# Patient Record
Sex: Male | Born: 1985 | Race: White | Hispanic: No | Marital: Married | Smoking: Never smoker
Health system: Southern US, Community
[De-identification: ages and names within clinical notes are randomized; demographics above are authoritative.]

## PROBLEM LIST (undated history)

## (undated) HISTORY — PX: TONSILLECTOMY: SUR1361

---

## 2018-04-25 MED FILL — CONCERTA 54 MG TABLET ER: 54 | 30 days supply | Qty: 30 | Fill #0

## 2018-04-25 MED FILL — METHYLPHENIDATE 10 MG TAB: 10 | 30 days supply | Qty: 60 | Fill #0

## 2018-05-31 MED FILL — CONCERTA 54 MG TABLET ER: 54 | 30 days supply | Qty: 30 | Fill #0

## 2018-05-31 MED FILL — METHYLPHENIDATE 10 MG TAB: 10 | 30 days supply | Qty: 60 | Fill #0

## 2018-06-30 MED FILL — CONCERTA 54 MG TABLET ER: 54 | 30 days supply | Qty: 30 | Fill #0

## 2018-06-30 MED FILL — METHYLPHENIDATE 10 MG TAB: 10 | 30 days supply | Qty: 60 | Fill #0

## 2018-08-01 MED FILL — CONCERTA 54 MG TABLET ER: 54 | 30 days supply | Qty: 30 | Fill #0

## 2018-08-01 MED FILL — METHYLPHENIDATE 10 MG TAB: 10 | 30 days supply | Qty: 60 | Fill #0

## 2018-09-15 MED FILL — METHYLPHENIDATE 10 MG TAB: 10 | 30 days supply | Qty: 60 | Fill #0

## 2018-09-15 MED FILL — CONCERTA 54 MG TABLET ER: 54 | 30 days supply | Qty: 30 | Fill #0

## 2018-10-17 DIAGNOSIS — F900 Attention-deficit hyperactivity disorder, predominantly inattentive type: Secondary | ICD-10-CM | POA: Diagnosis not present

## 2018-10-18 MED FILL — METHYLPHENIDATE 10 MG TAB: 10 | 60 days supply | Qty: 60 | Fill #0

## 2018-10-18 MED FILL — CONCERTA 54 MG TABLET ER: 54 | 30 days supply | Qty: 30 | Fill #0

## 2018-11-15 DIAGNOSIS — K921 Melena: Secondary | ICD-10-CM | POA: Diagnosis not present

## 2018-11-15 DIAGNOSIS — K644 Residual hemorrhoidal skin tags: Secondary | ICD-10-CM | POA: Diagnosis not present

## 2018-11-15 MED FILL — PROCTOZONE-HC 2.5 % CREA: 2.5 | 10 days supply | Qty: 30 | Fill #0

## 2018-11-27 MED FILL — CONCERTA 54 MG TABLET ER: 54 | 30 days supply | Qty: 30 | Fill #0

## 2018-12-05 MED FILL — METHYLPHENIDATE 10 MG TAB: 10 | 30 days supply | Qty: 60 | Fill #0

## 2018-12-30 MED FILL — METHYLPHENIDATE 10 MG TAB: 10 | 30 days supply | Qty: 60 | Fill #0

## 2018-12-30 MED FILL — CONCERTA 54 MG TABLET ER: 54 | 30 days supply | Qty: 30 | Fill #0

## 2019-02-08 MED FILL — CONCERTA 54 MG TABLET ER: 54 | 30 days supply | Qty: 30 | Fill #0

## 2019-02-08 MED FILL — METHYLPHENIDATE 10 MG TAB: 10 | 30 days supply | Qty: 60 | Fill #0

## 2019-03-14 MED FILL — CONCERTA 54 MG TABLET ER: 54 | 30 days supply | Qty: 30 | Fill #0

## 2019-03-14 MED FILL — METHYLPHENIDATE 10 MG TAB: 10 | 30 days supply | Qty: 60 | Fill #0

## 2019-04-26 MED FILL — METHYLPHENIDATE 10 MG TAB: 10 | 30 days supply | Qty: 60 | Fill #0

## 2019-04-26 MED FILL — CONCERTA 54 MG TABLET ER: 54 | 30 days supply | Qty: 30 | Fill #0

## 2019-05-07 DIAGNOSIS — M545 Low back pain: Secondary | ICD-10-CM | POA: Diagnosis not present

## 2019-05-07 DIAGNOSIS — F909 Attention-deficit hyperactivity disorder, unspecified type: Secondary | ICD-10-CM | POA: Diagnosis not present

## 2019-05-07 DIAGNOSIS — Z713 Dietary counseling and surveillance: Secondary | ICD-10-CM | POA: Diagnosis not present

## 2019-06-14 DIAGNOSIS — Z20828 Contact with and (suspected) exposure to other viral communicable diseases: Secondary | ICD-10-CM | POA: Diagnosis not present

## 2019-06-14 DIAGNOSIS — R030 Elevated blood-pressure reading, without diagnosis of hypertension: Secondary | ICD-10-CM | POA: Diagnosis not present

## 2019-06-16 DIAGNOSIS — Z7189 Other specified counseling: Secondary | ICD-10-CM | POA: Diagnosis not present

## 2019-06-16 DIAGNOSIS — Z03818 Encounter for observation for suspected exposure to other biological agents ruled out: Secondary | ICD-10-CM | POA: Diagnosis not present

## 2019-07-13 MED FILL — METHYLPHENIDATE 10 MG TAB: 10 | 30 days supply | Qty: 60 | Fill #0

## 2019-07-13 MED FILL — CONCERTA 54 MG TABLET ER: 54 | 30 days supply | Qty: 30 | Fill #0

## 2019-07-16 ENCOUNTER — Other Ambulatory Visit: Payer: Self-pay

## 2019-07-16 ENCOUNTER — Ambulatory Visit: Payer: Self-pay | Attending: Internal Medicine

## 2019-07-16 DIAGNOSIS — Z20828 Contact with and (suspected) exposure to other viral communicable diseases: Secondary | ICD-10-CM | POA: Insufficient documentation

## 2019-07-16 DIAGNOSIS — Z20822 Contact with and (suspected) exposure to covid-19: Secondary | ICD-10-CM

## 2019-07-17 LAB — NOVEL CORONAVIRUS, NAA: SARS-CoV-2, NAA: NOT DETECTED

## 2019-07-28 DIAGNOSIS — U071 COVID-19: Secondary | ICD-10-CM | POA: Diagnosis not present

## 2019-07-28 DIAGNOSIS — Z9189 Other specified personal risk factors, not elsewhere classified: Secondary | ICD-10-CM | POA: Diagnosis not present

## 2019-07-30 MED FILL — predniSONE 5 MG TABS: 5 | 6 days supply | Qty: 21 | Fill #0

## 2019-07-30 MED FILL — AZITHROMYCIN 250 MG TABLET: 250 | 5 days supply | Qty: 6 | Fill #0

## 2019-08-21 MED FILL — CONCERTA 54 MG TABLET ER: 54 | 30 days supply | Qty: 30 | Fill #0

## 2019-08-21 MED FILL — METHYLPHENIDATE 10 MG TAB: 10 | 30 days supply | Qty: 60 | Fill #0

## 2020-11-24 DIAGNOSIS — R079 Chest pain, unspecified: Secondary | ICD-10-CM | POA: Diagnosis not present

## 2020-11-24 DIAGNOSIS — F909 Attention-deficit hyperactivity disorder, unspecified type: Secondary | ICD-10-CM | POA: Diagnosis not present

## 2020-11-24 DIAGNOSIS — E7800 Pure hypercholesterolemia, unspecified: Secondary | ICD-10-CM | POA: Diagnosis not present

## 2021-01-08 DIAGNOSIS — M25511 Pain in right shoulder: Secondary | ICD-10-CM | POA: Diagnosis not present

## 2021-01-08 DIAGNOSIS — M25562 Pain in left knee: Secondary | ICD-10-CM | POA: Diagnosis not present

## 2021-03-26 ENCOUNTER — Ambulatory Visit
Admission: RE | Admit: 2021-03-26 | Discharge: 2021-03-26 | Disposition: A | Discharge status: Home / Self Care | Payer: BC Managed Care – PPO | Source: Ambulatory Visit | Attending: Sports Medicine | Admitting: Sports Medicine

## 2021-03-26 ENCOUNTER — Other Ambulatory Visit: Payer: Self-pay | Admitting: Sports Medicine

## 2021-03-26 DIAGNOSIS — M25562 Pain in left knee: Secondary | ICD-10-CM

## 2021-07-14 DIAGNOSIS — F909 Attention-deficit hyperactivity disorder, unspecified type: Secondary | ICD-10-CM | POA: Diagnosis not present

## 2021-07-14 DIAGNOSIS — M25569 Pain in unspecified knee: Secondary | ICD-10-CM | POA: Diagnosis not present

## 2021-07-14 DIAGNOSIS — H04203 Unspecified epiphora, bilateral lacrimal glands: Secondary | ICD-10-CM | POA: Diagnosis not present

## 2021-07-14 DIAGNOSIS — I10 Essential (primary) hypertension: Secondary | ICD-10-CM | POA: Diagnosis not present

## 2021-08-27 ENCOUNTER — Ambulatory Visit: Payer: BC Managed Care – PPO | Admitting: Allergy

## 2021-08-31 DIAGNOSIS — Z412 Encounter for routine and ritual male circumcision: Secondary | ICD-10-CM | POA: Diagnosis not present

## 2021-08-31 DIAGNOSIS — Z051 Observation and evaluation of newborn for suspected infectious condition ruled out: Secondary | ICD-10-CM | POA: Diagnosis not present

## 2021-08-31 DIAGNOSIS — Z23 Encounter for immunization: Secondary | ICD-10-CM | POA: Diagnosis not present

## 2021-09-05 DIAGNOSIS — Z00110 Health examination for newborn under 8 days old: Secondary | ICD-10-CM | POA: Diagnosis not present

## 2021-09-09 DIAGNOSIS — R633 Feeding difficulties, unspecified: Secondary | ICD-10-CM | POA: Diagnosis not present

## 2021-09-28 DIAGNOSIS — Z00129 Encounter for routine child health examination without abnormal findings: Secondary | ICD-10-CM | POA: Diagnosis not present

## 2021-09-28 DIAGNOSIS — Z23 Encounter for immunization: Secondary | ICD-10-CM | POA: Diagnosis not present

## 2021-10-21 DIAGNOSIS — R1110 Vomiting, unspecified: Secondary | ICD-10-CM | POA: Diagnosis not present

## 2021-11-03 DIAGNOSIS — Z00129 Encounter for routine child health examination without abnormal findings: Secondary | ICD-10-CM | POA: Diagnosis not present

## 2021-11-03 DIAGNOSIS — Z23 Encounter for immunization: Secondary | ICD-10-CM | POA: Diagnosis not present

## 2021-12-29 ENCOUNTER — Other Ambulatory Visit (HOSPITAL_COMMUNITY): Payer: Self-pay

## 2021-12-29 MED ORDER — METHYLPHENIDATE HCL ER (OSM) 54 MG PO TBCR
54.0000 mg | EXTENDED_RELEASE_TABLET | Freq: Every morning | ORAL | 0 refills | Status: AC
  Filled 2021-12-29 (×3): qty 16, 16d supply, fill #0

## 2021-12-30 ENCOUNTER — Other Ambulatory Visit (HOSPITAL_COMMUNITY): Payer: Self-pay

## 2021-12-30 MED ORDER — METHYLPHENIDATE HCL 20 MG PO TABS
20.0000 mg | ORAL_TABLET | Freq: Two times a day (BID) | ORAL | 0 refills | Status: DC
  Filled 2021-12-30 (×2): qty 32, 16d supply, fill #0

## 2021-12-31 ENCOUNTER — Other Ambulatory Visit (HOSPITAL_COMMUNITY): Payer: Self-pay

## 2022-03-08 DIAGNOSIS — H00019 Hordeolum externum unspecified eye, unspecified eyelid: Secondary | ICD-10-CM | POA: Diagnosis not present

## 2022-03-08 DIAGNOSIS — R0683 Snoring: Secondary | ICD-10-CM | POA: Diagnosis not present

## 2022-03-08 DIAGNOSIS — I10 Essential (primary) hypertension: Secondary | ICD-10-CM | POA: Diagnosis not present

## 2022-03-09 DIAGNOSIS — E7800 Pure hypercholesterolemia, unspecified: Secondary | ICD-10-CM | POA: Diagnosis not present

## 2022-03-09 DIAGNOSIS — R7301 Impaired fasting glucose: Secondary | ICD-10-CM | POA: Diagnosis not present

## 2022-03-09 DIAGNOSIS — E8889 Other specified metabolic disorders: Secondary | ICD-10-CM | POA: Diagnosis not present

## 2022-03-09 DIAGNOSIS — Z6841 Body Mass Index (BMI) 40.0 and over, adult: Secondary | ICD-10-CM | POA: Diagnosis not present

## 2022-03-09 DIAGNOSIS — Z9189 Other specified personal risk factors, not elsewhere classified: Secondary | ICD-10-CM | POA: Diagnosis not present

## 2022-03-09 DIAGNOSIS — R5383 Other fatigue: Secondary | ICD-10-CM | POA: Diagnosis not present

## 2022-03-23 DIAGNOSIS — R7309 Other abnormal glucose: Secondary | ICD-10-CM | POA: Diagnosis not present

## 2022-03-23 DIAGNOSIS — H16142 Punctate keratitis, left eye: Secondary | ICD-10-CM | POA: Diagnosis not present

## 2022-03-25 DIAGNOSIS — G4719 Other hypersomnia: Secondary | ICD-10-CM | POA: Diagnosis not present

## 2022-03-25 DIAGNOSIS — Z6841 Body Mass Index (BMI) 40.0 and over, adult: Secondary | ICD-10-CM | POA: Diagnosis not present

## 2022-03-25 DIAGNOSIS — E8881 Metabolic syndrome: Secondary | ICD-10-CM | POA: Diagnosis not present

## 2022-03-25 DIAGNOSIS — F909 Attention-deficit hyperactivity disorder, unspecified type: Secondary | ICD-10-CM | POA: Diagnosis not present

## 2022-04-21 ENCOUNTER — Ambulatory Visit: Payer: BC Managed Care – PPO | Admitting: Allergy

## 2022-05-03 DIAGNOSIS — G4733 Obstructive sleep apnea (adult) (pediatric): Secondary | ICD-10-CM | POA: Diagnosis not present

## 2022-05-26 ENCOUNTER — Ambulatory Visit: Payer: BC Managed Care – PPO | Admitting: Allergy

## 2022-06-03 DIAGNOSIS — G4733 Obstructive sleep apnea (adult) (pediatric): Secondary | ICD-10-CM | POA: Diagnosis not present

## 2022-07-02 ENCOUNTER — Ambulatory Visit: Payer: BC Managed Care – PPO | Admitting: Allergy

## 2022-07-03 DIAGNOSIS — G4733 Obstructive sleep apnea (adult) (pediatric): Secondary | ICD-10-CM | POA: Diagnosis not present

## 2022-07-09 ENCOUNTER — Other Ambulatory Visit: Payer: Self-pay

## 2022-07-09 ENCOUNTER — Ambulatory Visit (INDEPENDENT_AMBULATORY_CARE_PROVIDER_SITE_OTHER): Payer: BC Managed Care – PPO | Admitting: Allergy

## 2022-07-09 ENCOUNTER — Encounter: Payer: Self-pay | Admitting: Allergy

## 2022-07-09 VITALS — BP 116/90 | HR 84 | Temp 98.0°F | Resp 20 | Ht 77.0 in | Wt >= 6400 oz

## 2022-07-09 DIAGNOSIS — L505 Cholinergic urticaria: Secondary | ICD-10-CM

## 2022-07-09 DIAGNOSIS — H1013 Acute atopic conjunctivitis, bilateral: Secondary | ICD-10-CM

## 2022-07-09 DIAGNOSIS — J3089 Other allergic rhinitis: Secondary | ICD-10-CM | POA: Diagnosis not present

## 2022-07-09 MED ORDER — OLOPATADINE HCL 0.2 % OP SOLN: OPHTHALMIC | 5 refills | Status: AC

## 2022-07-09 MED ORDER — RYALTRIS 665-25 MCG/ACT NA SUSP: NASAL | 5 refills | Status: AC

## 2022-07-09 NOTE — Patient Instructions (Addendum)
Allergies - Testing today showed: grasses, ragweed, weeds, trees, and outdoor (with indoor) molds. - Copy of test results provided.  - Avoidance measures provided. - Start taking: Xyzal (levocetirizine) 5mg  tablet once daily. Ryaltris (olopatadine/mometasone) two sprays per nostril 1-2 times daily as needed. Pataday 1 drop each eye daily as needed for itchy/watery eyes.  - You can use an extra dose of the antihistamine, if needed, for breakthrough symptoms.  - Perform nasal saline rinses 1-2 times daily to remove allergens from the nasal cavities as well as help with mucous clearance (this is especially helpful to do before the nasal sprays are given) - Consider allergy shots as a means of long-term control. - Allergy shots "re-train" and "reset" the immune system to ignore environmental allergens and decrease the resulting immune response to those allergens (sneezing, itchy watery eyes, runny nose, nasal congestion, etc).    - Allergy shots improve symptoms in 75-85% of patients.  - We can discuss more at the next appointment if the medications are not working for you.  Cholinergic urticaria (hives)  - Hives can be caused by a variety of different triggers including illness/infection, foods, medications, stings, exercise, pressure, vibrations, extremes of temperature to name a few however majority of the time there is no identifiable trigger.   Cholinergic hives occur due to increases in body temperature  - during warmer months or times of exercise would use Xyzal 1 tab twice a day with Pepcid (has antihistamine properties) twice a day to help with rash control  Follow-up in 4-6 months or sooner if needed

## 2022-07-09 NOTE — Progress Notes (Signed)
New Patient Note  RE: Tony Graham MRN: [id number] DOB: 09-02-1985 Date of Office Visit: 07/09/2022  Primary care provider: Dr Lewie Chamber  Chief Complaint: allergies and rash  History of present illness: Tony Graham is a 36 y.o. male presenting today for evaluation of allergic rhinitis, dermatitis.   He states for years and years he has had issues with allergies.  He states he knows pollen affects him.  He has a indoor dog and feels that is also contibuting to symptoms.  He states he has runny nose, congestion, watery eyes, throat clearing sneezing, ear fullness.  Symptoms are year-round at this time.  He takes zyrtec off and on for years and does find it to be effective.  He has used flonase as needed that does help.  He has used rewetting eye drops.   No history of asthma, eczema or food allergy.    He does reports having a episodic itchy rash.  He states if he sweats or gets too hot it can develop and typically occurs on the arms/armpits, legs/lower extremities. This is has been occurring for past 10 year. Triamcinolone maybe helps.    Review of systems: Review of Systems  Constitutional: Negative.   HENT:         See HPI  Eyes:        See HPI  Respiratory: Negative.    Cardiovascular: Negative.   Musculoskeletal: Negative.   Skin: Negative.   Allergic/Immunologic: Negative.   Neurological: Negative.     All other systems negative unless noted above in HPI  Past medical history: History reviewed. No pertinent past medical history.  Past surgical history: Past Surgical History:  Procedure Laterality Date   TONSILLECTOMY      Family history:  Family History  Problem Relation Age of Onset   Allergic rhinitis Father     Social history: Social History   Socioeconomic History   Marital status: Married    Spouse name: Not on file   Number of children: Not on file   Years of education: Not on file   Highest education level: Not on file  Occupational History    Not on file  Tobacco Use   Smoking status: Never    Passive exposure: Never   Smokeless tobacco: Never  Vaping Use   Vaping Use: Never used  Substance and Sexual Activity   Alcohol use: Yes   Drug use: Never   Sexual activity: Not on file  Other Topics Concern   Not on file  Social History Narrative   Not on file   Social Determinants of Health   Financial Resource Strain: Not on file  Food Insecurity: Not on file  Transportation Needs: Not on file  Physical Activity: Not on file  Stress: Not on file  Social Connections: Not on file  Intimate Partner Violence: Not on file    Medication List: Current Outpatient Medications  Medication Sig Dispense Refill   methylphenidate (CONCERTA) 54 MG PO CR tablet Take 1 tablet (54 mg total) by mouth in the morning. 16 tablet 0   Olopatadine HCl (PATADAY) 0.2 % SOLN 1 drop each eye daily as needed for itchy, watery eyes. 2.5 mL 5   RYALTRIS 665-25 MCG/ACT SUSP 2 sprays per nostril 1-2 times daily as needed. 29 g 5   No current facility-administered medications for this visit.    Known medication allergies: Not on File   Physical examination: Blood pressure (!) 116/90, pulse 84, temperature 98 F (36.7  C), temperature source Temporal, resp. rate 20, height 6\' 5"  (1.956 m), weight (!) 400 lb 4.8 oz (181.6 kg), SpO2 94 %.  General: Alert, interactive, in no acute distress. HEENT: PERRLA, TMs pearly gray, turbinates markedly edematous with clear discharge, post-pharynx non erythematous. Neck: Supple without lymphadenopathy. Lungs: Clear to auscultation without wheezing, rhonchi or rales. {no increased work of breathing. CV: Normal S1, S2 without murmurs. Abdomen: Nondistended, nontender. Skin: Warm and dry, without lesions or rashes. Extremities:  No clubbing, cyanosis or edema. Neuro:   Grossly intact.  Diagnositics/Labs:  Allergy testing:   Airborne Adult Perc - 07/09/22 1034     Time Antigen Placed 1034    Allergen  Manufacturer 07/11/22    Location Back    Number of Test 59    Panel 1 Select    1. Control-Buffer 50% Glycerol Negative    2. Control-Histamine 1 mg/ml 2+    3. Albumin saline Negative    4. Bahia 3+    5. Waynette Buttery Negative    6. Johnson Negative    7. Kentucky Blue Negative    8. Meadow Fescue Negative    9. Perennial Rye Negative    10. Sweet Vernal Negative    11. Timothy Negative    12. Cocklebur Negative    13. Burweed Marshelder Negative    14. Ragweed, short Negative    15. Ragweed, Giant Negative    16. Plantain,  English Negative    17. Lamb's Quarters Negative    18. Sheep Sorrell Negative    19. Rough Pigweed Negative    20. Marsh Elder, Rough Negative    21. Mugwort, Common Negative    22. Ash mix Negative    23. Birch mix Negative    24. Beech American 2+    25. Box, Elder Negative    26. Cedar, red Negative    27. Cottonwood, [place] Negative    28. Elm mix Negative    29. Hickory Negative    30. Maple mix Negative    31. Oak, [place] mix 2+    32. Pecan Pollen Negative    33. Pine mix Negative    34. Sycamore Eastern Negative    35. Walnut, Black Pollen Negative    36. Alternaria alternata Negative    37. Cladosporium Herbarum Negative    38. Aspergillus mix Negative    39. Penicillium mix Negative    40. Bipolaris sorokiniana (Helminthosporium) Negative    41. Drechslera spicifera (Curvularia) Negative    42. Mucor plumbeus Negative    43. Fusarium moniliforme Negative    44. Aureobasidium pullulans (pullulara) Negative    45. Rhizopus oryzae Negative    46. Botrytis cinera Negative    47. Epicoccum nigrum Negative    48. Phoma betae Negative    49. Candida Albicans Negative    50. Trichophyton mentagrophytes Negative    51. Mite, D Farinae  5,000 AU/ml Negative    52. Mite, D Pteronyssinus  5,000 AU/ml Negative    53. Cat Hair 10,000 BAU/ml Negative    54.  Dog Epithelia Negative    55. Mixed Feathers Negative    56. Horse Epithelia Negative     57. Cockroach, German Negative    58. Mouse Negative    59. Tobacco Leaf Negative             Intradermal - 07/09/22 1107     Time Antigen Placed 1107    Allergen Manufacturer 07/11/22    Location Arm  Number of Test 12    Intradermal Select    Control Negative    7 Grass 4+    Ragweed mix 3+    Weed mix 3+    Mold 1 2+    Mold 2 Negative    Mold 3 Negative    Mold 4 Negative    Cat Negative    Dog Negative    Cockroach Negative    Mite mix Negative             Allergy testing results were read and interpreted by provider, documented by clinical staff.   Assessment and plan: Allergic rhinitis with conjunctivitis - Testing today showed: grasses, ragweed, weeds, trees, and outdoor (with indoor) molds. - Copy of test results provided.  - Avoidance measures provided. - Start taking: Xyzal (levocetirizine) 5mg  tablet once daily. Ryaltris (olopatadine/mometasone) two sprays per nostril 1-2 times daily as needed. Recommend Afrin use prior to Ryaltris use over the next 3 days to decongest the nose.  Aware to not use more than 3-5 days at a time.  Pataday 1 drop each eye daily as needed for itchy/watery eyes.  - You can use an extra dose of the antihistamine, if needed, for breakthrough symptoms.  - Perform nasal saline rinses 1-2 times daily to remove allergens from the nasal cavities as well as help with mucous clearance (this is especially helpful to do before the nasal sprays are given) - Consider allergy shots as a means of long-term control. - Allergy shots "re-train" and "reset" the immune system to ignore environmental allergens and decrease the resulting immune response to those allergens (sneezing, itchy watery eyes, runny nose, nasal congestion, etc).    - Allergy shots improve symptoms in 75-85% of patients.  - We can discuss more at the next appointment if the medications are not working for you.  Cholinergic urticaria (hives)  - Hives can be caused by a  variety of different triggers including illness/infection, foods, medications, stings, exercise, pressure, vibrations, extremes of temperature to name a few however majority of the time there is no identifiable trigger.   Cholinergic hives occur due to increases in body temperature  - during warmer months or times of exercise would use Xyzal 1 tab twice a day with Pepcid (has antihistamine properties) twice a day to help with rash control  Follow-up in 4-6 months or sooner if needed  I appreciate the opportunity to take part in Tony Graham's care. Please do not hesitate to contact me with questions.  Sincerely,   10-13-1995, MD Allergy/Immunology [facility]

## 2022-07-29 DIAGNOSIS — I10 Essential (primary) hypertension: Secondary | ICD-10-CM | POA: Diagnosis not present

## 2022-07-29 DIAGNOSIS — G4733 Obstructive sleep apnea (adult) (pediatric): Secondary | ICD-10-CM | POA: Diagnosis not present

## 2022-08-19 IMAGING — CR DG KNEE 3 VIEWS*L*
3 series · 3 of 3 positions shown · non-contrast
Comparison: None.

CLINICAL DATA: Fall, patellar pain

EXAM:
LEFT KNEE - 3 VIEW

[w knee ap left *]
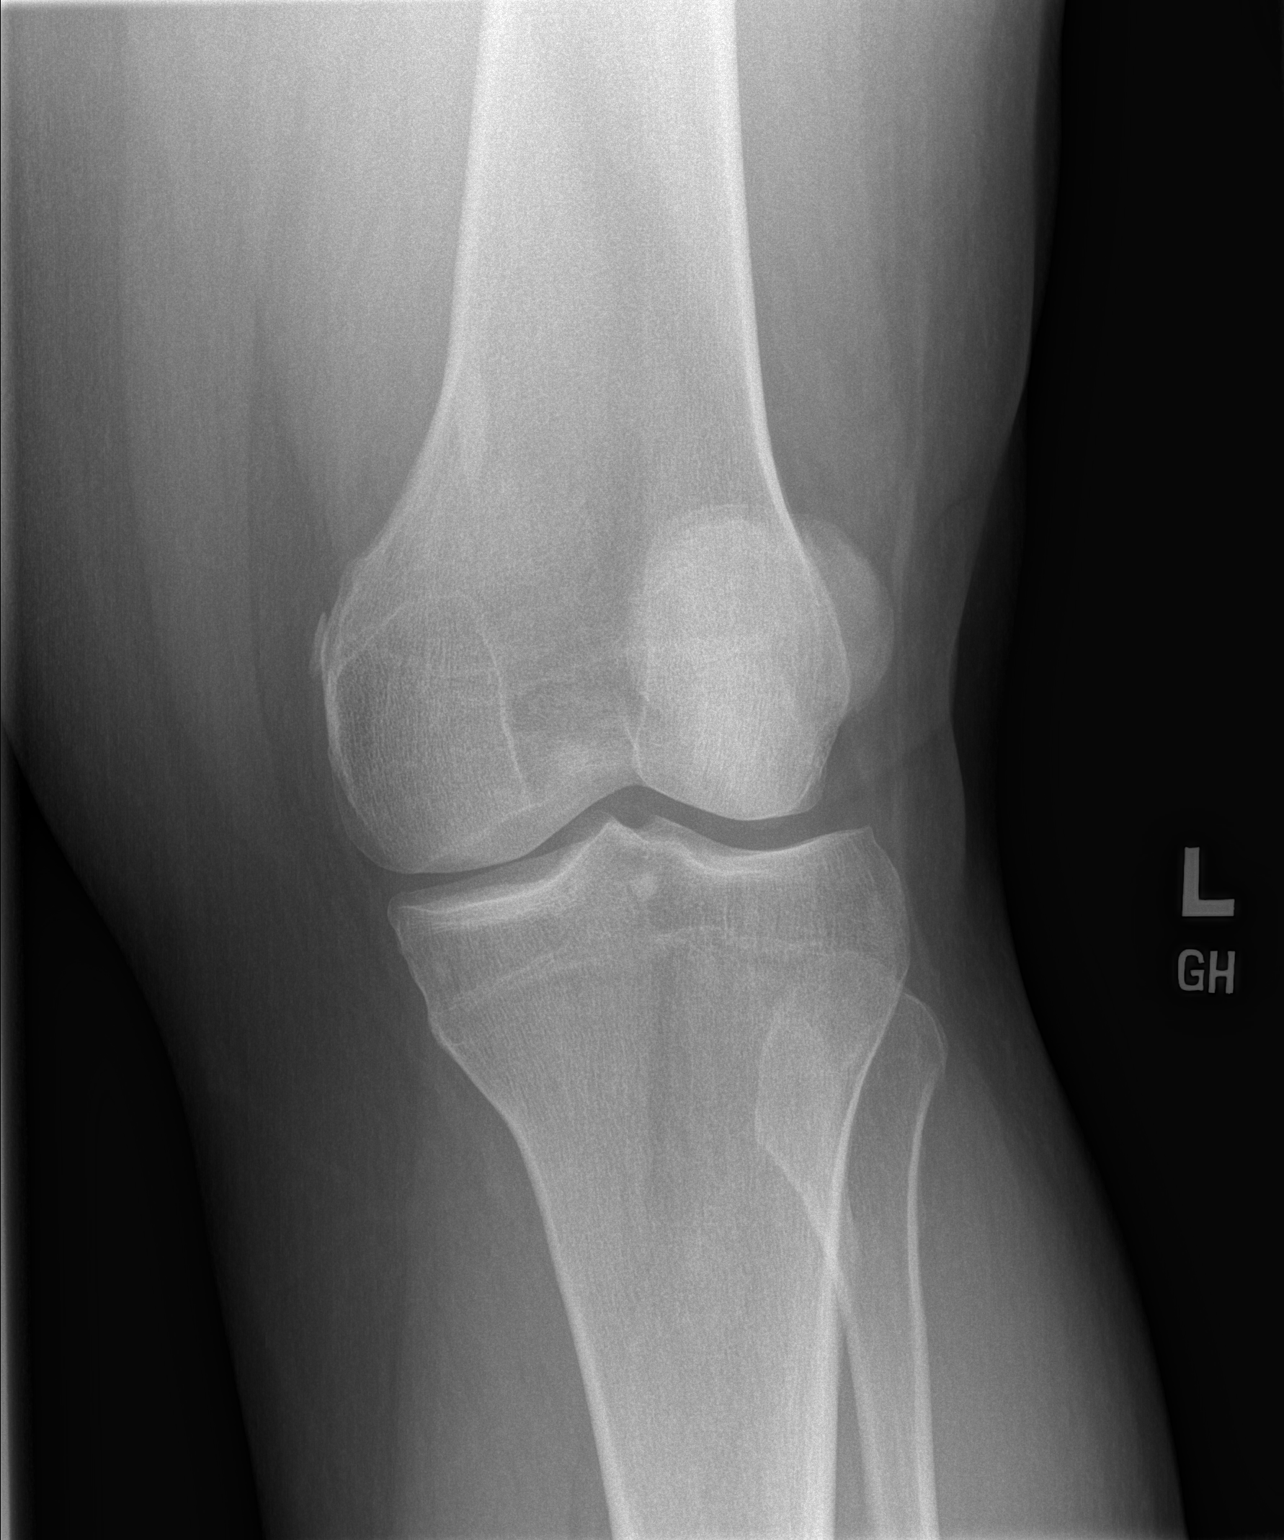

[w knee lat. left *]
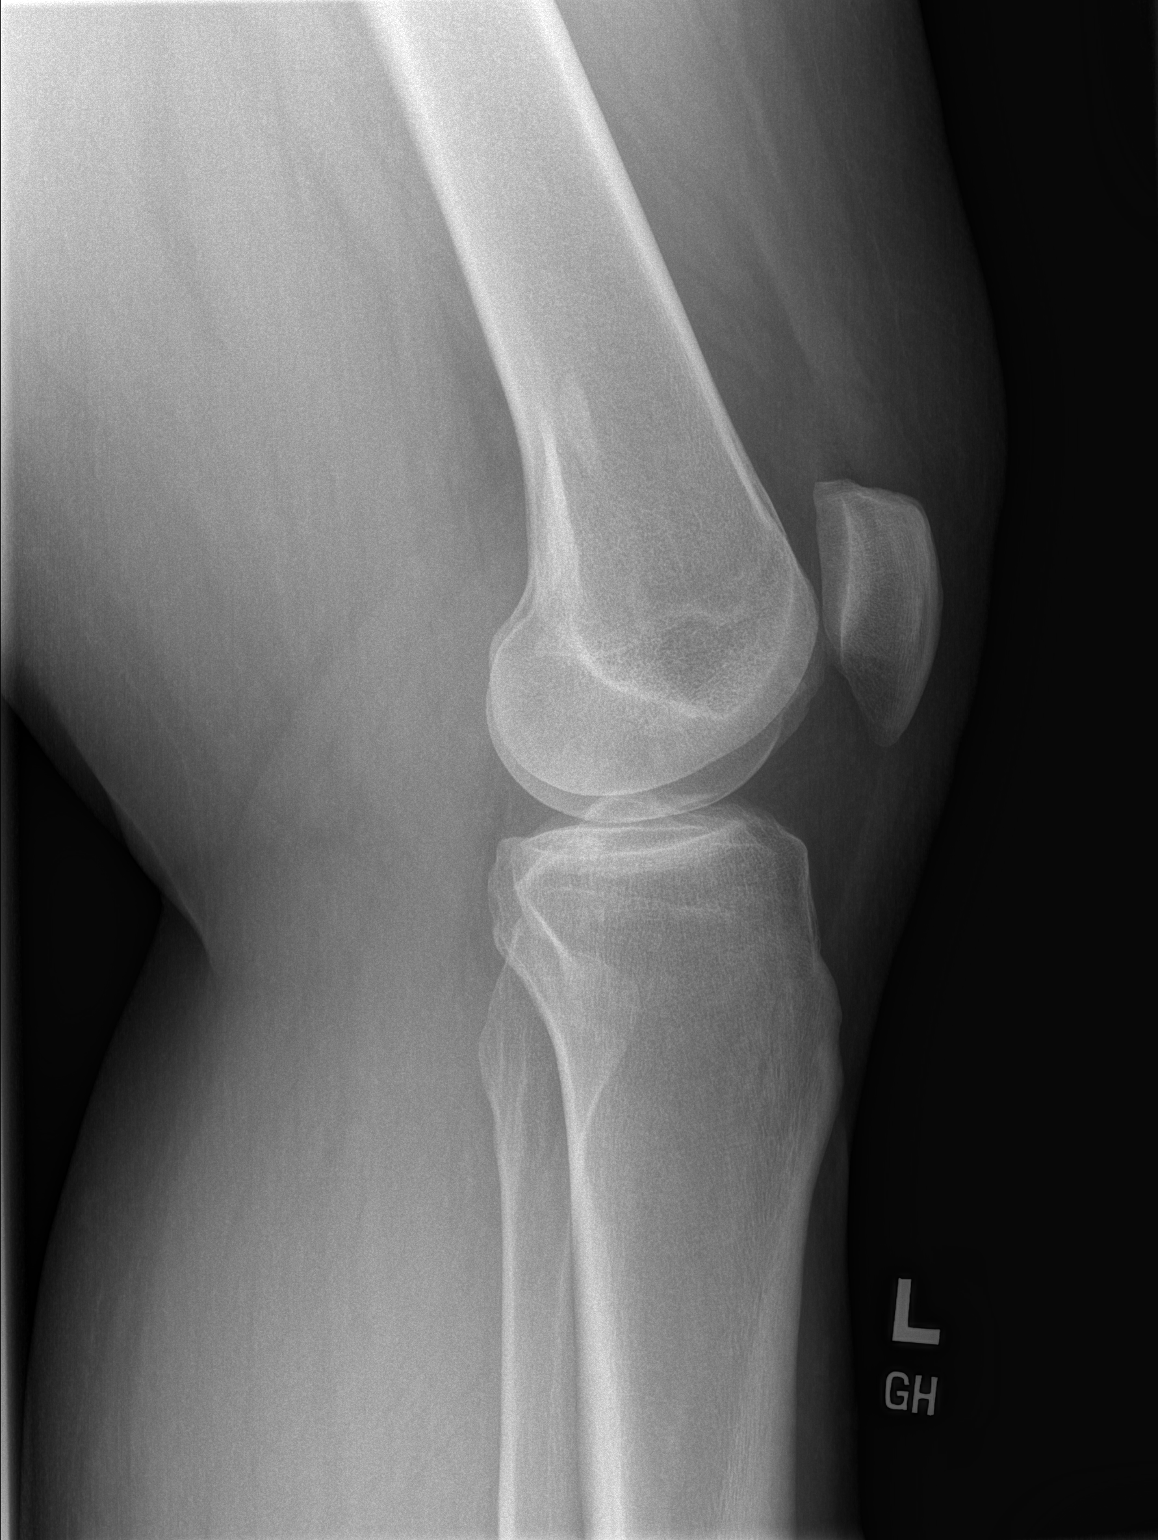

[t knee ap left]
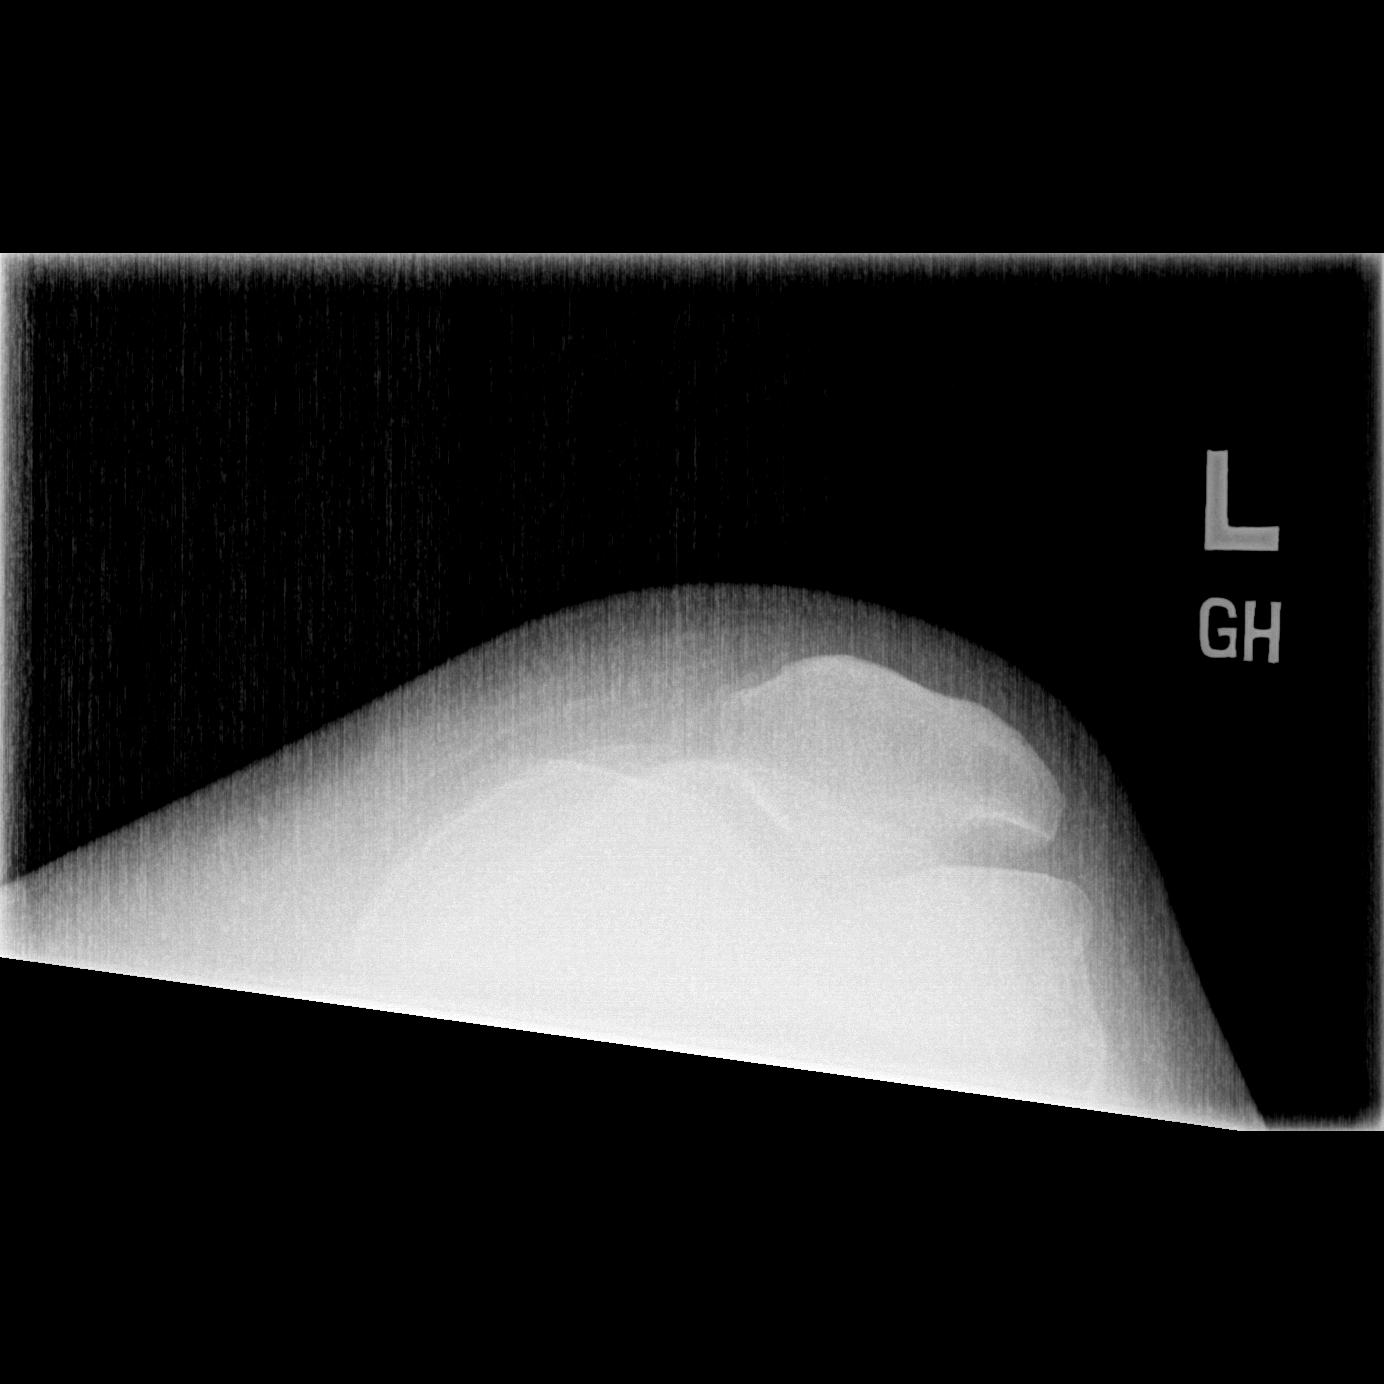

[3 of 3 positions shown; findings below may reference images not displayed]

FINDINGS: No evidence of fracture, dislocation, or joint effusion. No evidence
of arthropathy or other focal bone abnormality. Soft tissues are
unremarkable. Incidental sclerotic bone islands in the left distal
femur metaphysis and the midline of the tibia beneath the tibial
spines.
IMPRESSION: No acute osseous finding.

## 2022-09-21 DIAGNOSIS — M79652 Pain in left thigh: Secondary | ICD-10-CM | POA: Diagnosis not present

## 2022-10-04 DIAGNOSIS — G4733 Obstructive sleep apnea (adult) (pediatric): Secondary | ICD-10-CM | POA: Diagnosis not present

## 2022-11-10 ENCOUNTER — Ambulatory Visit: Payer: Self-pay | Admitting: Allergy

## 2022-11-10 DIAGNOSIS — J309 Allergic rhinitis, unspecified: Secondary | ICD-10-CM

## 2022-11-18 ENCOUNTER — Other Ambulatory Visit (HOSPITAL_COMMUNITY): Payer: Self-pay

## 2022-11-18 DIAGNOSIS — E7800 Pure hypercholesterolemia, unspecified: Secondary | ICD-10-CM | POA: Diagnosis not present

## 2022-11-18 DIAGNOSIS — F909 Attention-deficit hyperactivity disorder, unspecified type: Secondary | ICD-10-CM | POA: Diagnosis not present

## 2022-11-18 DIAGNOSIS — I10 Essential (primary) hypertension: Secondary | ICD-10-CM | POA: Diagnosis not present

## 2022-11-18 DIAGNOSIS — G4733 Obstructive sleep apnea (adult) (pediatric): Secondary | ICD-10-CM | POA: Diagnosis not present

## 2022-11-18 DIAGNOSIS — R7303 Prediabetes: Secondary | ICD-10-CM | POA: Diagnosis not present

## 2022-11-18 MED ORDER — ZEPBOUND 10 MG/0.5ML ~~LOC~~ SOAJ
10.0000 mg | SUBCUTANEOUS | 1 refills | Status: AC
  Filled 2022-11-18 (×2): qty 2, 28d supply, fill #0

## 2022-11-18 MED ORDER — ESCITALOPRAM OXALATE 10 MG PO TABS
10.0000 mg | ORAL_TABLET | Freq: Every day | ORAL | 1 refills | Status: AC
  Filled 2022-11-18: qty 90, 90d supply, fill #0

## 2022-11-18 MED ORDER — PROPRANOLOL HCL 20 MG PO TABS
20.0000 mg | ORAL_TABLET | Freq: Three times a day (TID) | ORAL | 1 refills | Status: AC | PRN
  Filled 2022-11-18: qty 90, 30d supply, fill #0

## 2022-11-19 ENCOUNTER — Other Ambulatory Visit (HOSPITAL_COMMUNITY): Payer: Self-pay

## 2022-11-22 ENCOUNTER — Other Ambulatory Visit (HOSPITAL_COMMUNITY): Payer: Self-pay

## 2023-01-12 ENCOUNTER — Other Ambulatory Visit (HOSPITAL_COMMUNITY): Payer: Self-pay

## 2023-01-12 MED ORDER — ZEPBOUND 12.5 MG/0.5ML ~~LOC~~ SOAJ
12.5000 mg | SUBCUTANEOUS | 0 refills | Status: AC
  Filled 2023-01-12: qty 2, 28d supply, fill #0

## 2023-01-12 MED ORDER — ZEPBOUND 10 MG/0.5ML ~~LOC~~ SOAJ: 10.0000 mg | SUBCUTANEOUS | 0 refills | Status: AC

## 2023-01-12 MED ORDER — ZEPBOUND 15 MG/0.5ML ~~LOC~~ SOAJ
15.0000 mg | SUBCUTANEOUS | 0 refills | Status: DC
  Filled 2023-04-21: qty 2, 28d supply, fill #0

## 2023-02-10 ENCOUNTER — Other Ambulatory Visit (HOSPITAL_COMMUNITY): Payer: Self-pay

## 2023-02-10 MED ORDER — ZEPBOUND 15 MG/0.5ML ~~LOC~~ SOAJ
15.0000 mg | SUBCUTANEOUS | 0 refills | Status: AC
  Filled 2023-02-10: qty 2, 28d supply, fill #0

## 2023-02-11 ENCOUNTER — Other Ambulatory Visit (HOSPITAL_COMMUNITY): Payer: Self-pay

## 2023-02-17 DIAGNOSIS — M25561 Pain in right knee: Secondary | ICD-10-CM | POA: Diagnosis not present

## 2023-02-17 DIAGNOSIS — M25511 Pain in right shoulder: Secondary | ICD-10-CM | POA: Diagnosis not present

## 2023-03-15 ENCOUNTER — Other Ambulatory Visit (HOSPITAL_COMMUNITY): Payer: Self-pay

## 2023-03-15 MED ORDER — ZEPBOUND 15 MG/0.5ML ~~LOC~~ SOAJ
15.0000 mg | SUBCUTANEOUS | 0 refills | Status: AC
  Filled 2023-03-15: qty 2, 28d supply, fill #0

## 2023-03-16 ENCOUNTER — Other Ambulatory Visit (HOSPITAL_COMMUNITY): Payer: Self-pay

## 2023-03-17 DIAGNOSIS — F411 Generalized anxiety disorder: Secondary | ICD-10-CM | POA: Diagnosis not present

## 2023-03-17 DIAGNOSIS — F5081 Binge eating disorder: Secondary | ICD-10-CM | POA: Diagnosis not present

## 2023-03-17 DIAGNOSIS — F410 Panic disorder [episodic paroxysmal anxiety] without agoraphobia: Secondary | ICD-10-CM | POA: Diagnosis not present

## 2023-04-21 ENCOUNTER — Other Ambulatory Visit (HOSPITAL_COMMUNITY): Payer: Self-pay

## 2023-04-22 ENCOUNTER — Other Ambulatory Visit (HOSPITAL_COMMUNITY): Payer: Self-pay

## 2023-05-19 ENCOUNTER — Other Ambulatory Visit (HOSPITAL_COMMUNITY): Payer: Self-pay

## 2023-05-19 DIAGNOSIS — F411 Generalized anxiety disorder: Secondary | ICD-10-CM | POA: Diagnosis not present

## 2023-05-19 DIAGNOSIS — K219 Gastro-esophageal reflux disease without esophagitis: Secondary | ICD-10-CM | POA: Diagnosis not present

## 2023-05-19 DIAGNOSIS — R7303 Prediabetes: Secondary | ICD-10-CM | POA: Diagnosis not present

## 2023-05-19 DIAGNOSIS — Z79899 Other long term (current) drug therapy: Secondary | ICD-10-CM | POA: Diagnosis not present

## 2023-05-19 MED ORDER — LISDEXAMFETAMINE DIMESYLATE 60 MG PO CAPS
60.0000 mg | ORAL_CAPSULE | Freq: Every day | ORAL | 0 refills | Status: DC
  Filled 2023-05-19: qty 30, 30d supply, fill #0

## 2023-05-19 MED ORDER — DESVENLAFAXINE SUCCINATE ER 50 MG PO TB24
50.0000 mg | ORAL_TABLET | Freq: Every day | ORAL | 3 refills | Status: AC
  Filled 2023-05-19: qty 30, 30d supply, fill #0

## 2023-05-19 MED ORDER — ZEPBOUND 15 MG/0.5ML ~~LOC~~ SOAJ
15.0000 mg | SUBCUTANEOUS | 11 refills | Status: DC
  Filled 2023-05-19: qty 2, 28d supply, fill #0
  Filled 2023-07-01: qty 2, 28d supply, fill #1
  Filled 2023-08-03: qty 2, 28d supply, fill #2

## 2023-05-19 MED ORDER — ALPRAZOLAM 1 MG PO TABS
1.0000 mg | ORAL_TABLET | Freq: Every day | ORAL | 0 refills | Status: AC | PRN
  Filled 2023-05-19: qty 30, 30d supply, fill #0

## 2023-05-20 ENCOUNTER — Other Ambulatory Visit (HOSPITAL_COMMUNITY): Payer: Self-pay

## 2023-05-23 ENCOUNTER — Other Ambulatory Visit (HOSPITAL_COMMUNITY): Payer: Self-pay

## 2023-05-23 ENCOUNTER — Other Ambulatory Visit: Payer: Self-pay

## 2023-05-25 ENCOUNTER — Other Ambulatory Visit (HOSPITAL_COMMUNITY): Payer: Self-pay

## 2023-06-28 ENCOUNTER — Other Ambulatory Visit (HOSPITAL_COMMUNITY): Payer: Self-pay

## 2023-06-28 MED ORDER — METHYLPHENIDATE HCL 10 MG PO TABS
10.0000 mg | ORAL_TABLET | Freq: Two times a day (BID) | ORAL | 0 refills | Status: AC
  Filled 2023-06-28: qty 60, 30d supply, fill #0

## 2023-06-28 MED ORDER — LISDEXAMFETAMINE DIMESYLATE 60 MG PO CAPS
60.0000 mg | ORAL_CAPSULE | Freq: Every day | ORAL | 0 refills | Status: AC
  Filled 2023-06-28: qty 30, 30d supply, fill #0

## 2023-06-28 MED ORDER — ALPRAZOLAM 1 MG PO TABS
1.0000 mg | ORAL_TABLET | Freq: Every day | ORAL | 0 refills | Status: AC | PRN
  Filled 2023-06-28: qty 30, 30d supply, fill #0

## 2023-07-01 ENCOUNTER — Other Ambulatory Visit (HOSPITAL_COMMUNITY): Payer: Self-pay

## 2023-07-04 ENCOUNTER — Other Ambulatory Visit (HOSPITAL_COMMUNITY): Payer: Self-pay

## 2023-07-05 DIAGNOSIS — K625 Hemorrhage of anus and rectum: Secondary | ICD-10-CM | POA: Diagnosis not present

## 2023-07-05 DIAGNOSIS — K649 Unspecified hemorrhoids: Secondary | ICD-10-CM | POA: Diagnosis not present

## 2023-07-07 ENCOUNTER — Other Ambulatory Visit (HOSPITAL_COMMUNITY): Payer: Self-pay

## 2023-08-03 ENCOUNTER — Other Ambulatory Visit (HOSPITAL_COMMUNITY): Payer: Self-pay

## 2023-10-07 ENCOUNTER — Other Ambulatory Visit (HOSPITAL_COMMUNITY): Payer: Self-pay

## 2023-10-07 MED ORDER — ALPRAZOLAM 1 MG PO TABS
1.0000 mg | ORAL_TABLET | Freq: Every day | ORAL | 0 refills | Status: AC
  Filled 2023-10-07: qty 30, 30d supply, fill #0

## 2023-10-07 MED ORDER — LISDEXAMFETAMINE DIMESYLATE 60 MG PO CAPS
60.0000 mg | ORAL_CAPSULE | Freq: Every day | ORAL | 0 refills | Status: AC
  Filled 2023-10-07: qty 30, 30d supply, fill #0

## 2023-10-07 MED ORDER — METHYLPHENIDATE HCL 10 MG PO TABS
10.0000 mg | ORAL_TABLET | Freq: Two times a day (BID) | ORAL | 0 refills | Status: AC
  Filled 2023-10-07: qty 60, 30d supply, fill #0

## 2023-10-25 ENCOUNTER — Other Ambulatory Visit (HOSPITAL_COMMUNITY): Payer: Self-pay

## 2023-10-25 MED ORDER — ZEPBOUND 15 MG/0.5ML ~~LOC~~ SOAJ
SUBCUTANEOUS | 3 refills | Status: AC
  Filled 2023-10-25: qty 2, 28d supply, fill #0
  Filled 2023-12-24: qty 2, 28d supply, fill #1
  Filled 2024-02-20: qty 2, 28d supply, fill #2
  Filled 2024-05-23: qty 2, 28d supply, fill #3

## 2023-10-26 ENCOUNTER — Other Ambulatory Visit: Payer: Self-pay

## 2023-12-24 ENCOUNTER — Other Ambulatory Visit (HOSPITAL_COMMUNITY): Payer: Self-pay

## 2023-12-26 ENCOUNTER — Other Ambulatory Visit (HOSPITAL_COMMUNITY): Payer: Self-pay

## 2023-12-26 MED ORDER — ZEPBOUND 15 MG/0.5ML ~~LOC~~ SOAJ
15.0000 mg | SUBCUTANEOUS | 1 refills | Status: AC
  Filled 2024-07-31: qty 2, 28d supply, fill #0

## 2023-12-27 ENCOUNTER — Other Ambulatory Visit (HOSPITAL_COMMUNITY): Payer: Self-pay

## 2024-01-10 ENCOUNTER — Other Ambulatory Visit: Payer: Self-pay

## 2024-01-10 ENCOUNTER — Other Ambulatory Visit (HOSPITAL_COMMUNITY): Payer: Self-pay

## 2024-01-10 MED ORDER — ALPRAZOLAM 1 MG PO TABS
1.0000 mg | ORAL_TABLET | Freq: Every day | ORAL | 0 refills | Status: AC | PRN
  Filled 2024-01-10: qty 30, 30d supply, fill #0

## 2024-01-10 MED ORDER — LISDEXAMFETAMINE DIMESYLATE 60 MG PO CAPS
60.0000 mg | ORAL_CAPSULE | Freq: Every day | ORAL | 0 refills | Status: DC
  Filled 2024-01-10: qty 30, 30d supply, fill #0

## 2024-01-10 MED ORDER — METHYLPHENIDATE HCL 10 MG PO TABS
10.0000 mg | ORAL_TABLET | Freq: Two times a day (BID) | ORAL | 0 refills | Status: DC
  Filled 2024-01-10: qty 60, 30d supply, fill #0

## 2024-01-14 ENCOUNTER — Other Ambulatory Visit (HOSPITAL_COMMUNITY): Payer: Self-pay

## 2024-01-14 MED ORDER — LISDEXAMFETAMINE DIMESYLATE 60 MG PO CAPS
60.0000 mg | ORAL_CAPSULE | Freq: Every day | ORAL | 0 refills | Status: AC
  Filled 2024-02-08: qty 30, 30d supply, fill #0

## 2024-01-14 MED ORDER — LISDEXAMFETAMINE DIMESYLATE 60 MG PO CAPS
60.0000 mg | ORAL_CAPSULE | Freq: Every day | ORAL | 0 refills | Status: DC
  Filled 2024-03-10: qty 30, 30d supply, fill #0

## 2024-01-14 MED ORDER — ALPRAZOLAM 1 MG PO TABS
1.0000 mg | ORAL_TABLET | Freq: Every day | ORAL | 0 refills | Status: AC | PRN
  Filled 2024-02-08: qty 30, 30d supply, fill #0

## 2024-01-14 MED ORDER — METHYLPHENIDATE HCL 10 MG PO TABS
10.0000 mg | ORAL_TABLET | Freq: Two times a day (BID) | ORAL | 0 refills | Status: DC
  Filled 2024-03-08 – 2024-03-09 (×3): qty 60, 30d supply, fill #0

## 2024-01-14 MED ORDER — ALPRAZOLAM 1 MG PO TABS
1.0000 mg | ORAL_TABLET | Freq: Every day | ORAL | 0 refills | Status: AC | PRN
  Filled 2024-03-08 – 2024-03-09 (×3): qty 30, 30d supply, fill #0

## 2024-01-14 MED ORDER — METHYLPHENIDATE HCL 10 MG PO TABS
10.0000 mg | ORAL_TABLET | Freq: Two times a day (BID) | ORAL | 0 refills | Status: AC
  Filled 2024-02-08: qty 60, 30d supply, fill #0

## 2024-01-19 ENCOUNTER — Other Ambulatory Visit (HOSPITAL_COMMUNITY): Payer: Self-pay

## 2024-01-19 MED ORDER — ZEPBOUND 15 MG/0.5ML ~~LOC~~ SOAJ
15.0000 mg | SUBCUTANEOUS | 3 refills | Status: AC
  Filled 2024-01-19: qty 2, 28d supply, fill #0

## 2024-02-08 ENCOUNTER — Other Ambulatory Visit (HOSPITAL_COMMUNITY): Payer: Self-pay

## 2024-02-20 ENCOUNTER — Other Ambulatory Visit (HOSPITAL_COMMUNITY): Payer: Self-pay

## 2024-03-08 ENCOUNTER — Other Ambulatory Visit (HOSPITAL_COMMUNITY): Payer: Self-pay

## 2024-03-09 ENCOUNTER — Other Ambulatory Visit (HOSPITAL_COMMUNITY): Payer: Self-pay

## 2024-03-10 ENCOUNTER — Other Ambulatory Visit (HOSPITAL_COMMUNITY): Payer: Self-pay

## 2024-03-19 ENCOUNTER — Other Ambulatory Visit (HOSPITAL_COMMUNITY): Payer: Self-pay

## 2024-03-19 MED ORDER — ZEPBOUND 15 MG/0.5ML ~~LOC~~ SOAJ
15.0000 mg | SUBCUTANEOUS | 3 refills | Status: AC
  Filled 2024-03-19: qty 2, 28d supply, fill #0

## 2024-03-20 ENCOUNTER — Other Ambulatory Visit (HOSPITAL_COMMUNITY): Payer: Self-pay

## 2024-04-04 ENCOUNTER — Other Ambulatory Visit (HOSPITAL_COMMUNITY): Payer: Self-pay

## 2024-04-04 MED ORDER — METHYLPHENIDATE HCL 10 MG PO TABS
10.0000 mg | ORAL_TABLET | Freq: Two times a day (BID) | ORAL | 0 refills | Status: DC
  Filled 2024-04-06: qty 60, 30d supply, fill #0

## 2024-04-04 MED ORDER — LISDEXAMFETAMINE DIMESYLATE 60 MG PO CAPS
60.0000 mg | ORAL_CAPSULE | Freq: Every day | ORAL | 0 refills | Status: DC
  Filled 2024-04-06 – 2024-04-07 (×2): qty 30, 30d supply, fill #0

## 2024-04-04 MED ORDER — ALPRAZOLAM 1 MG PO TABS
1.0000 mg | ORAL_TABLET | Freq: Every day | ORAL | 0 refills | Status: AC
  Filled 2024-04-06: qty 30, 30d supply, fill #0

## 2024-04-05 ENCOUNTER — Other Ambulatory Visit (HOSPITAL_COMMUNITY): Payer: Self-pay

## 2024-04-06 ENCOUNTER — Other Ambulatory Visit (HOSPITAL_COMMUNITY): Payer: Self-pay

## 2024-04-07 ENCOUNTER — Other Ambulatory Visit (HOSPITAL_COMMUNITY): Payer: Self-pay

## 2024-04-19 ENCOUNTER — Other Ambulatory Visit (HOSPITAL_COMMUNITY): Payer: Self-pay

## 2024-04-19 MED ORDER — ZEPBOUND 15 MG/0.5ML ~~LOC~~ SOAJ
15.0000 mg | SUBCUTANEOUS | 3 refills | Status: AC
  Filled 2024-04-19: qty 2, 28d supply, fill #0
  Filled 2024-06-25: qty 2, 28d supply, fill #1
  Filled 2024-07-31: qty 2, 28d supply, fill #2

## 2024-04-20 ENCOUNTER — Other Ambulatory Visit (HOSPITAL_COMMUNITY): Payer: Self-pay

## 2024-04-20 MED ORDER — HYDROCORTISONE ACETATE 25 MG RE SUPP
RECTAL | 1 refills | Status: AC
  Filled 2024-04-20: qty 30, 30d supply, fill #0

## 2024-04-30 ENCOUNTER — Other Ambulatory Visit (HOSPITAL_COMMUNITY): Payer: Self-pay

## 2024-05-07 ENCOUNTER — Other Ambulatory Visit (HOSPITAL_COMMUNITY): Payer: Self-pay

## 2024-05-07 MED ORDER — METHYLPHENIDATE HCL 10 MG PO TABS
10.0000 mg | ORAL_TABLET | Freq: Two times a day (BID) | ORAL | 0 refills | Status: DC
  Filled 2024-05-07: qty 60, 30d supply, fill #0

## 2024-05-07 MED ORDER — ALPRAZOLAM 1 MG PO TABS
1.0000 mg | ORAL_TABLET | Freq: Every day | ORAL | 0 refills | Status: DC | PRN
  Filled 2024-05-07: qty 30, 30d supply, fill #0

## 2024-05-07 MED ORDER — LISDEXAMFETAMINE DIMESYLATE 60 MG PO CAPS
60.0000 mg | ORAL_CAPSULE | Freq: Every day | ORAL | 0 refills | Status: DC
  Filled 2024-05-07: qty 30, 30d supply, fill #0

## 2024-05-23 ENCOUNTER — Other Ambulatory Visit (HOSPITAL_COMMUNITY): Payer: Self-pay

## 2024-06-04 ENCOUNTER — Other Ambulatory Visit (HOSPITAL_COMMUNITY): Payer: Self-pay

## 2024-06-04 MED ORDER — METHYLPHENIDATE HCL 10 MG PO TABS
10.0000 mg | ORAL_TABLET | Freq: Two times a day (BID) | ORAL | 0 refills | Status: DC
  Filled 2024-06-04 – 2024-06-05 (×2): qty 60, 30d supply, fill #0

## 2024-06-04 MED ORDER — ALPRAZOLAM 1 MG PO TABS
1.0000 mg | ORAL_TABLET | Freq: Every day | ORAL | 0 refills | Status: DC | PRN
  Filled 2024-06-04 – 2024-06-05 (×2): qty 30, 30d supply, fill #0

## 2024-06-04 MED ORDER — LISDEXAMFETAMINE DIMESYLATE 60 MG PO CAPS
60.0000 mg | ORAL_CAPSULE | Freq: Every day | ORAL | 0 refills | Status: DC
  Filled 2024-06-04 – 2024-06-05 (×2): qty 30, 30d supply, fill #0

## 2024-06-05 ENCOUNTER — Other Ambulatory Visit (HOSPITAL_COMMUNITY): Payer: Self-pay

## 2024-06-05 ENCOUNTER — Other Ambulatory Visit: Payer: Self-pay

## 2024-06-25 ENCOUNTER — Other Ambulatory Visit (HOSPITAL_COMMUNITY): Payer: Self-pay

## 2024-06-27 ENCOUNTER — Other Ambulatory Visit (HOSPITAL_COMMUNITY): Payer: Self-pay

## 2024-07-02 ENCOUNTER — Ambulatory Visit: Payer: Self-pay | Admitting: Surgery

## 2024-07-02 NOTE — H&P (Signed)
 07/02/2024   PATIENT NAME: Tony Graham MRN: [id number] DOB: 1985/11/16 PHYSICIANS:  REFERRING PHYSICIAN: Leonel Cole, MD  CARE TEAM:  Patient Care Team: Leonel Cole, MD as PCP - General (Family Medicine) Magod, Oliva Earnest, MD (Gastroenterology) Leonel Cole, MD as Referring Physician (Family Medicine) Sheldon, Elspeth Bitter, MD as Consulting Provider (General Surgery)  CONSULTING PROVIDER: ELSPETH BITTER SHELDON, MD  SUBJECTIVE   Chief Complaint: New Consultation (Hemorrhoids)   Tony Graham is a 38 y.o. male  who is seen today as an office consultation  at the request of DrRONITA Leonel  for evaluation of anal pain and bleeding. Hemorrhoids?  History of Present Illness: Level of Interpreter Services: No interpreter needed (no language barrier)  Pleasant 38 year old male. S Has had struggling's with perianal itching burning and sometimes sharp pains with bowel movements. Patient had bleeding when he wipes. Denies any bouts of constipation or diarrhea and claims he moves his bowels about once a day  Followed by [employer]. Had a colonoscopy earlier this year in January by Dr Tony Graham which was pretty underwhelming for some mild diverticular disease and 1 polyp was removed. He recalls being told a 10-year follow-up (hyperplastic polyp? Awaiting path) he claims he moves his bowels once a day. Occasionally gets up to urinate but no severe nocturia or difficulty starting stream. No UTIs.  He is rather physically active. Does not smoke. No diabetes. He maybe has a couple drinks of alcohol a week at the most. He had an open appendectomy when he was a teenager. No other abdominal surgeries. No history of Crohn's or colitis or inflammatory bowel disease. PMHx ADHD and anxiety rather stable. On GLP-1 inhibitor Zepbound  with 100 pounds of intentional weight loss.   Medical History:  Past Medical History:  Diagnosis Date  ADHD  Hypertension   Patient Active Problem List   Diagnosis  Closed wedge compression fracture of T8 vertebra (CMS/HHS-HCC)  Diverticulosis of colon  Chronic anal fissure  Bright red rectal bleeding  Prolapsed internal hemorrhoids, grade 2   Past Surgical History:  Procedure Laterality Date  APPENDECTOMY  rt foot surgery  tennis elbow Right    No Known Allergies  Current Outpatient Medications on File Prior to Visit  Medication Sig Dispense Refill  amLODIPine-benazepril (LOTREL) 5-20 mg capsule Take 1 capsule by mouth once daily. 12  cyclobenzaprine (FLEXERIL) 5 MG tablet Take 1 tablet (5 mg total) by mouth 3 (three) times daily. 90 tablet 2  methylphenidate  HCl (CONCERTA ) 54 MG ER tablet Take 54 mg by mouth every morning.   methylphenidate  HCl (RITALIN ) 10 MG tablet Take 10 mg by mouth once daily.    No current facility-administered medications on file prior to visit.   Family History  Problem Relation Age of Onset  No Known Problems Mother  No Known Problems Father    Social History   Tobacco Use  Smoking Status Former  Types: Cigarettes  Smokeless Tobacco Never  Tobacco Comments  occasionally    Social History   Socioeconomic History  Marital status: Single  Tobacco Use  Smoking status: Former  Types: Cigarettes  Smokeless tobacco: Never  Tobacco comments:  occasionally  Vaping Use  Vaping status: Unknown  Substance and Sexual Activity  Alcohol use: Yes  Comment: occasionally  Drug use: No   Social Drivers of Health   Housing Stability: Unknown (06/12/2024)  Housing Stability Vital Sign  Homeless in the Last Year: No   ############################################################  Review of Systems: A complete review of systems (  ROS) was obtained from the patient.  We have reviewed this information and discussed as appropriate with the patient.  See HPI as well for other pertinent ROS.  Constitutional: No fevers, chills, sweats. Weight stable Eyes: No vision changes, No discharge HENT:  No sore throats, nasal drainage Lymph: No neck swelling, No bruising easily Pulmonary: No cough, productive sputum CV: No orthopnea, PND . No exertional chest/neck/shoulder/arm pain. Patient can walk 30 minutes without difficulty.   GI: No personal nor family history of GI/colon cancer, inflammatory bowel disease, irritable bowel syndrome, allergy  such as Celiac Sprue, dietary/dairy problems, colitis, ulcers nor gastritis. No recent sick contacts/gastroenteritis. No travel outside the country. No changes in diet.  Renal: No UTIs, No hematuria Genital: No drainage, bleeding, masses Musculoskeletal: No severe joint pain. Good ROM major joints Skin: No sores or lesions Heme/Lymph: No easy bleeding. No swollen lymph nodes Neuro: No active seizures. No facial droop Psych: No hallucinations. No agitation  OBJECTIVE   Vitals:  06/12/24 0916  BP: 130/80  Pulse: 95  Resp: 16  Temp: 36.7 C (98.1 F)  SpO2: 97%  Weight: (!) 126.5 kg (278 lb 12.8 oz)  Height: 193 cm (6' 4)  PainSc: 10-Worst pain ever   Body mass index is 33.94 kg/m.  PHYSICAL EXAM:  Constitutional: Not cachectic. Hygeine adequate. Vitals signs as above.  Eyes: No glasses. Vision adequate,Pupils reactive, normal extraocular movements. Sclera nonicteric Neuro: CN II-XII intact. No major focal sensory defects. No major motor deficits. Lymph: No head/neck/groin lymphadenopathy Psych: No severe agitation. No severe anxiety. Judgment & insight Adequate, Oriented x4, HENT: Normocephalic, Mucus membranes moist. No thrush. Hearing: adequate Neck: Supple, No tracheal deviation. No obvious thyromegaly Chest: No pain to chest wall compression. Good respiratory excursion. No audible wheezing CV: Pulses intact. regular. No major extremity edema Ext: No obvious deformity or contracture. Edema: Not present. No cyanosis Skin: No major subcutaneous nodules. Some occasional moisture consistent with sweating especially in perineum and  axillae. No evidence of Hidradenitis or rashes. Skin dry Musculoskeletal: Severe joint rigidity not present. No obvious clubbing. No digital petechiae. Mobility: no assist device moving easily without restrictions  Abdomen: Obese Soft. Nondistended. Nontender. Hernia: Small impulse at umbilical stalk suspicious for 5 mm umbilical hernia. Asymptomatic. Noticed on Valsalva cough only.. Diastasis recti: Not present. No hepatomegaly. No splenomegaly.  Genital/Pelvic: Inguinal hernia: Not present. Inguinal lymph nodes: without lymphadenopathy nor hidradenitis.   Rectal: Perianal skin Clean with good hygiene  Pruritis ani: Not present Pilonidal disease: Not present  External hemorrhoids: Not present Condyloma / warts: Not present Anal fissure: Posterior midline in mid canal is a fissure that is sensitive. Perirectal abscess/fistula: Not present Sphincter tone: Normal with anal spasming  Digital and anoscopic rectal exam: Barely tolerated  Hemorrhoidal piles: Grade 1-2 internal hemorrhoids. No obvious ulceration or friability. Prostate: Normal size & no nodularity Rectovaginal septum: N/A Rectal masses: Not present  Other significant findings: N/A  PE Chaperone note: Russell Christine, CMA, was included in the room as chaperone for sensitive portions of the exam   ###################################  ###################################################################  Labs, Imaging and Diagnostic Testing:  Located in 'Care Everywhere' section of Epic EMR chart  PRIOR [facility] CLINIC NOTES:  Not applicable  SURGERY NOTES:  Not applicable  PATHOLOGY:  Not applicable  Assessment and Plan:  DIAGNOSES:  Diagnoses and all orders for this visit:  Chronic anal fissure  Diverticulosis of colon  Bright red rectal bleeding  Prolapsed internal hemorrhoids, grade 2    ASSESSMENT/PLAN  Pleasant active  male dyschezia and mild bleeding with evidence of a posterior midline anal  canal chronic fissure. Hemorrhoids grade 1-2.  I would focus on doing a trial of diltiazem cream in the hopes that will get this to heal and avoid surgery. Surgery is back up. The anatomy & physiology of the anorectal region was discussed. The pathophysiology of anal fissure and differential diagnosis was discussed. Natural history progression was discussed. I stressed the importance of a bowel regimen to have daily soft bowel movements to minimize progression of disease. I discussed the use of warm soaks & muscle relaxant, diltiazem, to help the anal sphincter relax, allow the spasming to stop, and help the tear/fissure to heal.  If non-operative treatment does not heal the fissure, I would recommend examination under anesthesia for better examination to confirm the diagnosis and treat by lateral internal sphincterotomy to allow the fissure to heal. Technique, benefits, alternatives discussed. Risks such as bleeding, pain, incontinence, recurrence, heart attack, death, and other risks were discussed. Educational handouts further explaining the pathology, treatment options, and bowel regimen were given as well.      ADDENDUM Not resolved Wishes surgery Will do partial lat anal sphinctertomy w EUA   Elspeth KYM Schultze, MD, FACS, MASCRS Esophageal, Gastrointestinal & Colorectal Surgery Robotic and Minimally Invasive Surgery  [facility] [address] [place], [place] [address] [phone] Fax [phone] Main  CONTACT INFORMATION: Weekday (9AM-5PM): Call [facility] main office at [phone] Weeknight (5PM-9AM) or Weekend/Holiday: Check EPIC Web Links tab & use AMION (password  [email or web address]) for General Surgery [facility] coverage  Please, DO NOT use SecureChat  (it is not reliable communication to reach operating surgeons & will lead to a delay in care).   Epic staff messaging available for outpatient concerns needing 1-2 business day  response.       07/02/2024

## 2024-07-04 ENCOUNTER — Other Ambulatory Visit: Payer: Self-pay

## 2024-07-04 ENCOUNTER — Other Ambulatory Visit (HOSPITAL_COMMUNITY): Payer: Self-pay

## 2024-07-04 MED ORDER — ALPRAZOLAM 1 MG PO TABS
1.0000 mg | ORAL_TABLET | Freq: Every day | ORAL | 0 refills | Status: DC | PRN
  Filled 2024-07-04: qty 30, 30d supply, fill #0

## 2024-07-04 MED ORDER — LISDEXAMFETAMINE DIMESYLATE 60 MG PO CAPS
60.0000 mg | ORAL_CAPSULE | Freq: Every day | ORAL | 0 refills | Status: DC
  Filled 2024-07-04: qty 30, 30d supply, fill #0

## 2024-07-04 MED ORDER — METHYLPHENIDATE HCL 10 MG PO TABS
10.0000 mg | ORAL_TABLET | Freq: Two times a day (BID) | ORAL | 0 refills | Status: AC
  Filled 2024-07-04: qty 60, 30d supply, fill #0

## 2024-07-31 ENCOUNTER — Other Ambulatory Visit (HOSPITAL_COMMUNITY): Payer: Self-pay

## 2024-08-01 ENCOUNTER — Other Ambulatory Visit (HOSPITAL_COMMUNITY): Payer: Self-pay

## 2024-08-01 MED ORDER — LISDEXAMFETAMINE DIMESYLATE 60 MG PO CAPS
60.0000 mg | ORAL_CAPSULE | Freq: Every day | ORAL | 0 refills | Status: DC
  Filled 2024-08-01 – 2024-08-03 (×3): qty 30, 30d supply, fill #0

## 2024-08-01 MED ORDER — ALPRAZOLAM 1 MG PO TABS
1.0000 mg | ORAL_TABLET | Freq: Every day | ORAL | 0 refills | Status: DC | PRN
  Filled 2024-08-01: qty 30, 30d supply, fill #0

## 2024-08-02 ENCOUNTER — Other Ambulatory Visit (HOSPITAL_COMMUNITY): Payer: Self-pay

## 2024-08-03 ENCOUNTER — Other Ambulatory Visit: Payer: Self-pay

## 2024-08-03 ENCOUNTER — Other Ambulatory Visit (HOSPITAL_COMMUNITY): Payer: Self-pay

## 2024-08-03 MED ORDER — METHYLPHENIDATE HCL 10 MG PO TABS
10.0000 mg | ORAL_TABLET | Freq: Two times a day (BID) | ORAL | 0 refills | Status: DC
  Filled 2024-08-03: qty 60, 30d supply, fill #0

## 2024-08-06 ENCOUNTER — Other Ambulatory Visit: Payer: Self-pay

## 2024-08-28 ENCOUNTER — Other Ambulatory Visit (HOSPITAL_COMMUNITY): Payer: Self-pay

## 2024-08-28 MED ORDER — ZEPBOUND 15 MG/0.5ML ~~LOC~~ SOAJ
15.0000 mg | SUBCUTANEOUS | 3 refills | Status: AC
  Filled 2024-08-28 – 2024-08-29 (×2): qty 2, 28d supply, fill #0

## 2024-08-29 ENCOUNTER — Other Ambulatory Visit (HOSPITAL_COMMUNITY): Payer: Self-pay

## 2024-08-29 MED ORDER — ALPRAZOLAM 1 MG PO TABS
1.0000 mg | ORAL_TABLET | Freq: Every day | ORAL | 0 refills | Status: AC | PRN
  Filled 2024-08-29: qty 30, 30d supply, fill #0

## 2024-08-29 MED ORDER — METHYLPHENIDATE HCL 10 MG PO TABS
10.0000 mg | ORAL_TABLET | Freq: Two times a day (BID) | ORAL | 0 refills | Status: AC
  Filled 2024-08-29: qty 60, 30d supply, fill #0

## 2024-08-29 MED ORDER — LISDEXAMFETAMINE DIMESYLATE 60 MG PO CAPS
60.0000 mg | ORAL_CAPSULE | Freq: Every day | ORAL | 0 refills | Status: AC
  Filled 2024-08-29 (×2): qty 30, 30d supply, fill #0
